# Patient Record
Sex: Female | Born: 1970 | Race: Black or African American | Hispanic: No | Marital: Single | Smoking: Never smoker
Health system: Southern US, Community
[De-identification: ages and names within clinical notes are randomized; demographics above are authoritative.]

## PROBLEM LIST (undated history)

## (undated) DIAGNOSIS — Z973 Presence of spectacles and contact lenses: Secondary | ICD-10-CM

## (undated) DIAGNOSIS — D509 Iron deficiency anemia, unspecified: Secondary | ICD-10-CM

## (undated) DIAGNOSIS — Z8719 Personal history of other diseases of the digestive system: Secondary | ICD-10-CM

## (undated) DIAGNOSIS — J45909 Unspecified asthma, uncomplicated: Secondary | ICD-10-CM

## (undated) DIAGNOSIS — D259 Leiomyoma of uterus, unspecified: Secondary | ICD-10-CM

## (undated) DIAGNOSIS — Z8711 Personal history of peptic ulcer disease: Secondary | ICD-10-CM

## (undated) DIAGNOSIS — L910 Hypertrophic scar: Secondary | ICD-10-CM

---

## 1999-05-20 ENCOUNTER — Other Ambulatory Visit: Admission: RE | Admit: 1999-05-20 | Discharge: 1999-05-20 | Payer: Self-pay | Admitting: Obstetrics and Gynecology

## 2000-07-15 ENCOUNTER — Other Ambulatory Visit: Admission: RE | Admit: 2000-07-15 | Discharge: 2000-07-15 | Payer: Self-pay | Admitting: Obstetrics and Gynecology

## 2001-12-01 ENCOUNTER — Other Ambulatory Visit: Admission: RE | Admit: 2001-12-01 | Discharge: 2001-12-01 | Payer: Self-pay | Admitting: Obstetrics and Gynecology

## 2004-02-26 ENCOUNTER — Other Ambulatory Visit: Admission: RE | Admit: 2004-02-26 | Discharge: 2004-02-26 | Payer: Self-pay | Admitting: Family Medicine

## 2004-09-26 ENCOUNTER — Observation Stay (HOSPITAL_COMMUNITY): Admission: RE | Admit: 2004-09-26 | Discharge: 2004-09-27 | Payer: Self-pay | Admitting: *Deleted

## 2004-09-26 ENCOUNTER — Encounter (INDEPENDENT_AMBULATORY_CARE_PROVIDER_SITE_OTHER): Payer: Self-pay | Admitting: *Deleted

## 2004-09-26 HISTORY — PX: LAPAROSCOPIC CHOLECYSTECTOMY: SUR755

## 2005-03-31 ENCOUNTER — Other Ambulatory Visit: Admission: RE | Admit: 2005-03-31 | Discharge: 2005-03-31 | Payer: Self-pay | Admitting: Family Medicine

## 2007-06-23 ENCOUNTER — Encounter (INDEPENDENT_AMBULATORY_CARE_PROVIDER_SITE_OTHER): Payer: Self-pay | Admitting: Nurse Practitioner

## 2007-06-23 ENCOUNTER — Ambulatory Visit: Payer: Self-pay | Admitting: *Deleted

## 2007-06-23 ENCOUNTER — Ambulatory Visit: Payer: Self-pay | Admitting: Family Medicine

## 2007-06-23 LAB — CONVERTED CEMR LAB
Alkaline Phosphatase: 52 units/L (ref 39–117)
Basophils Absolute: 0 10*3/uL (ref 0.0–0.1)
Eosinophils Absolute: 0.5 10*3/uL (ref 0.0–0.7)
Eosinophils Relative: 6 % — ABNORMAL HIGH (ref 0–5)
Glucose, Bld: 104 mg/dL — ABNORMAL HIGH (ref 70–99)
HCT: 39.1 % (ref 36.0–46.0)
Lymphs Abs: 2.3 10*3/uL (ref 0.7–4.0)
MCV: 92.2 fL (ref 78.0–100.0)
Platelets: 304 10*3/uL (ref 150–400)
RDW: 14.9 % (ref 11.5–15.5)
Sodium: 141 meq/L (ref 135–145)
Total Bilirubin: 0.4 mg/dL (ref 0.3–1.2)
Total Protein: 7 g/dL (ref 6.0–8.3)

## 2007-08-17 ENCOUNTER — Ambulatory Visit: Payer: Self-pay | Admitting: Internal Medicine

## 2007-08-17 ENCOUNTER — Encounter (INDEPENDENT_AMBULATORY_CARE_PROVIDER_SITE_OTHER): Payer: Self-pay | Admitting: Nurse Practitioner

## 2007-08-17 LAB — CONVERTED CEMR LAB
Cholesterol: 168 mg/dL (ref 0–200)
HDL: 50 mg/dL (ref >39)
Total CHOL/HDL Ratio: 3.4
Triglycerides: 52 mg/dL (ref <150)

## 2007-09-01 ENCOUNTER — Ambulatory Visit (HOSPITAL_COMMUNITY): Admission: RE | Admit: 2007-09-01 | Discharge: 2007-09-01 | Payer: Self-pay | Admitting: Family Medicine

## 2008-06-26 ENCOUNTER — Ambulatory Visit: Payer: Self-pay | Admitting: Internal Medicine

## 2008-06-26 ENCOUNTER — Encounter (INDEPENDENT_AMBULATORY_CARE_PROVIDER_SITE_OTHER): Payer: Self-pay | Admitting: Internal Medicine

## 2008-06-26 LAB — CONVERTED CEMR LAB
Basophils Absolute: 0 10*3/uL (ref 0.0–0.1)
Basophils Relative: 0 % (ref 0–1)
Eosinophils Absolute: 0.3 10*3/uL (ref 0.0–0.7)
MCHC: 33.9 g/dL (ref 30.0–36.0)
MCV: 90.3 fL (ref 78.0–100.0)
Neutrophils Relative %: 70 % (ref 43–77)
Platelets: 302 10*3/uL (ref 150–400)
RDW: 14.4 % (ref 11.5–15.5)
WBC: 8 10*3/uL (ref 4.0–10.5)

## 2008-08-17 ENCOUNTER — Ambulatory Visit (HOSPITAL_COMMUNITY): Admission: RE | Admit: 2008-08-17 | Discharge: 2008-08-17 | Payer: Self-pay | Admitting: Internal Medicine

## 2008-10-31 ENCOUNTER — Emergency Department (HOSPITAL_COMMUNITY): Admission: EM | Admit: 2008-10-31 | Discharge: 2008-10-31 | Payer: Self-pay | Admitting: Family Medicine

## 2008-11-07 ENCOUNTER — Ambulatory Visit: Payer: Self-pay | Admitting: Obstetrics and Gynecology

## 2009-03-26 ENCOUNTER — Encounter (INDEPENDENT_AMBULATORY_CARE_PROVIDER_SITE_OTHER): Payer: Self-pay | Admitting: Internal Medicine

## 2009-03-26 ENCOUNTER — Ambulatory Visit: Payer: Self-pay | Admitting: Internal Medicine

## 2009-03-26 LAB — CONVERTED CEMR LAB
Albumin: 4 g/dL (ref 3.5–5.2)
BUN: 11 mg/dL (ref 6–23)
Calcium: 8.6 mg/dL (ref 8.4–10.5)
Chloride: 108 meq/L (ref 96–112)
Glucose, Bld: 87 mg/dL (ref 70–99)
Potassium: 3.8 meq/L (ref 3.5–5.3)
Sodium: 141 meq/L (ref 135–145)
Total Protein: 7 g/dL (ref 6.0–8.3)

## 2009-12-31 ENCOUNTER — Ambulatory Visit (HOSPITAL_COMMUNITY): Admission: RE | Admit: 2009-12-31 | Discharge: 2009-12-31 | Payer: Self-pay | Admitting: Internal Medicine

## 2009-12-31 IMAGING — MG MM DIGITAL SCREENING
4 series · 4 of 4 positions shown · non-contrast
Comparison: Prior studies.

DG SCREEN MAMMOGRAM BILATERAL
Bilateral CC and MLO view(s) were taken.
Technologist: SIFUL.(SIFUL)(M)

DIGITAL SCREENING MAMMOGRAM WITH CAD:

[R CC]
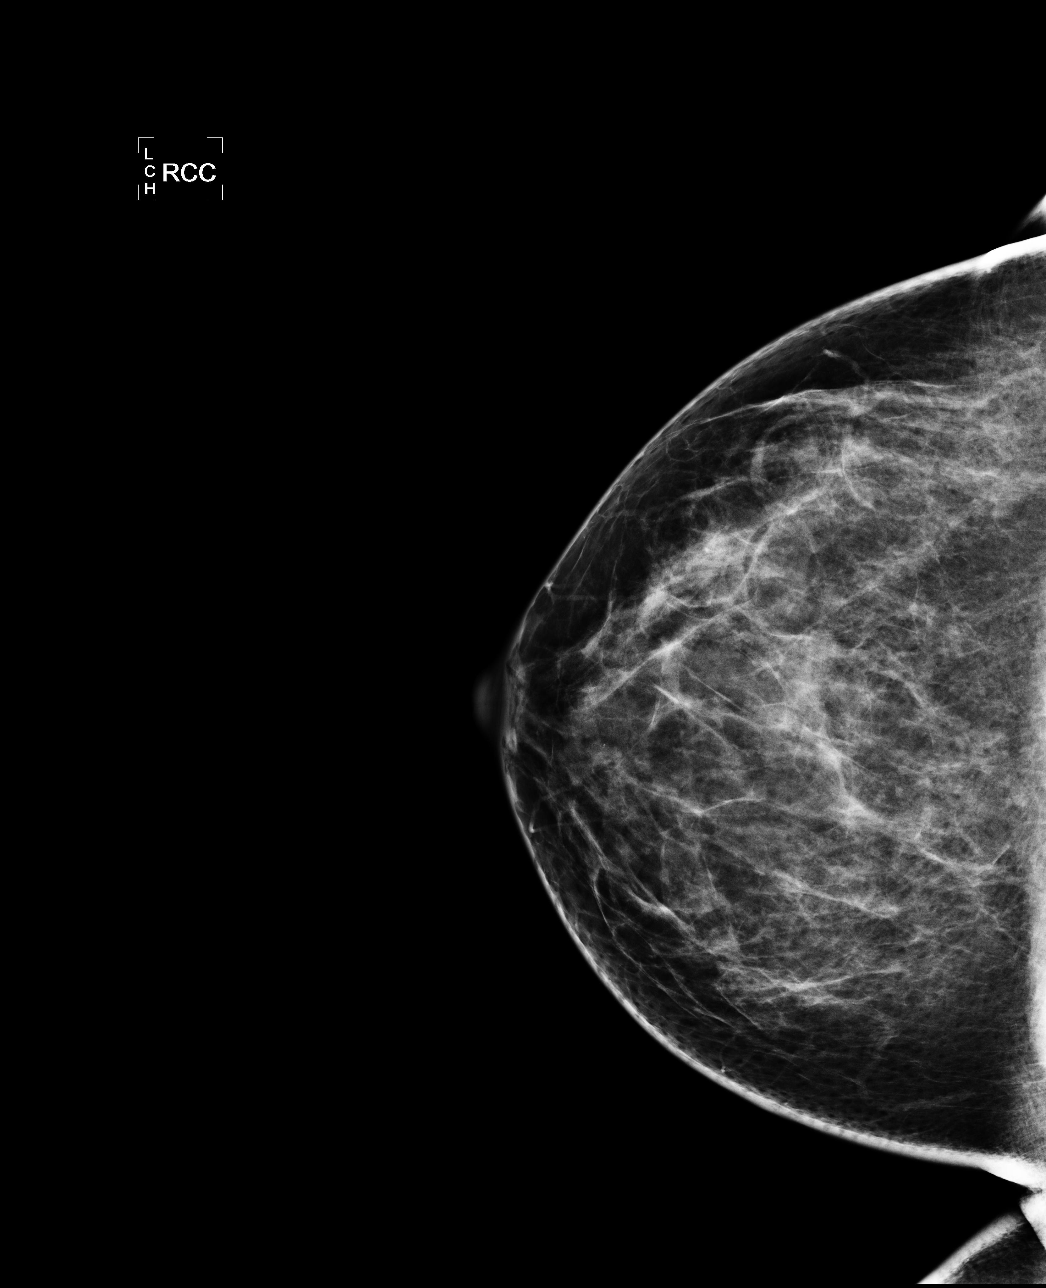

[R MLO]
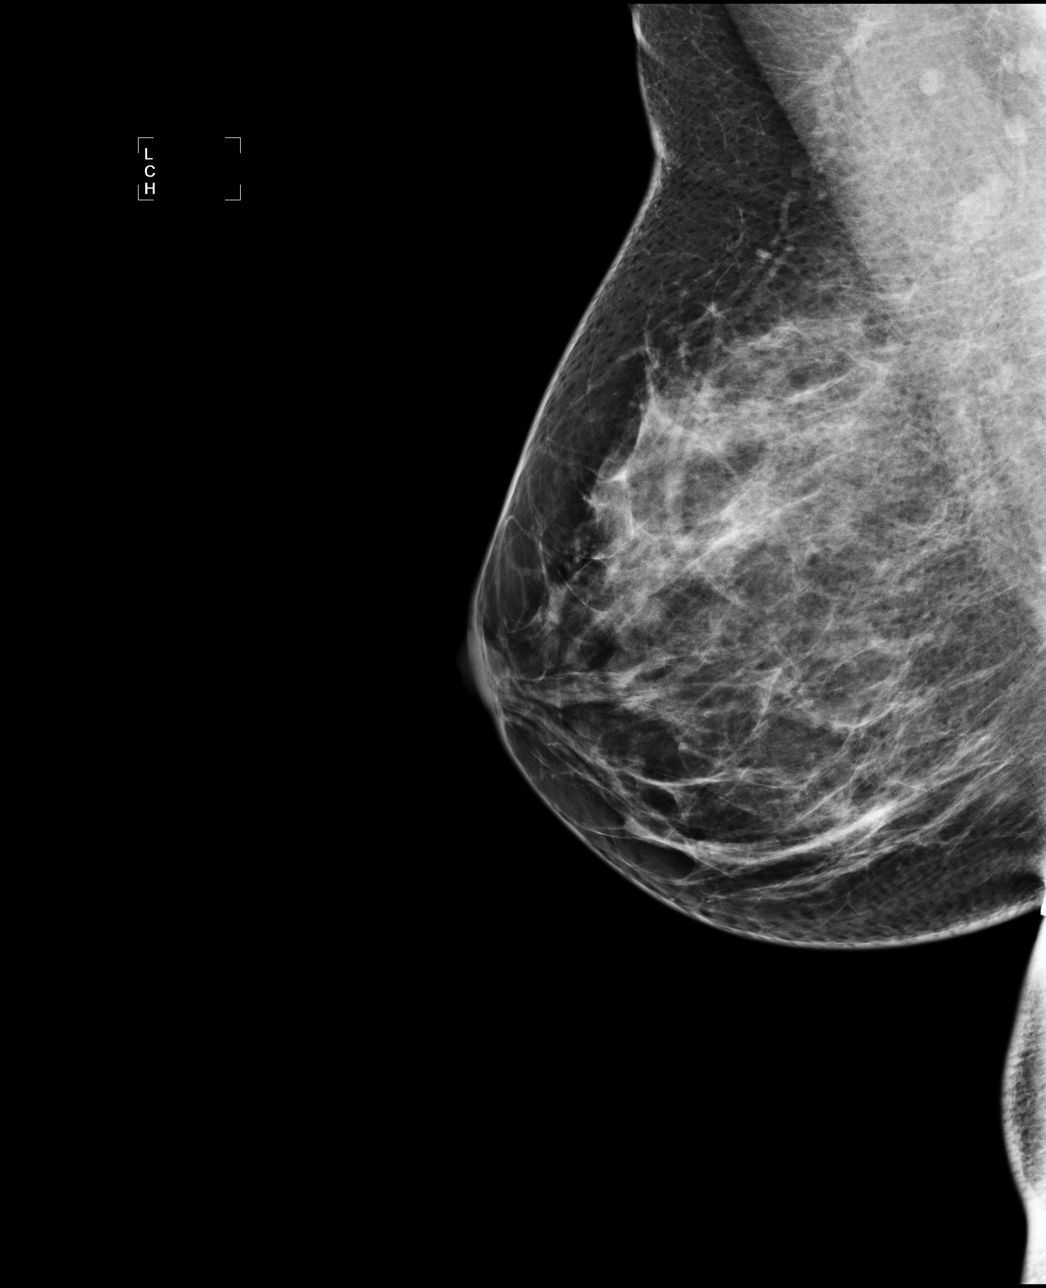

[L CC]
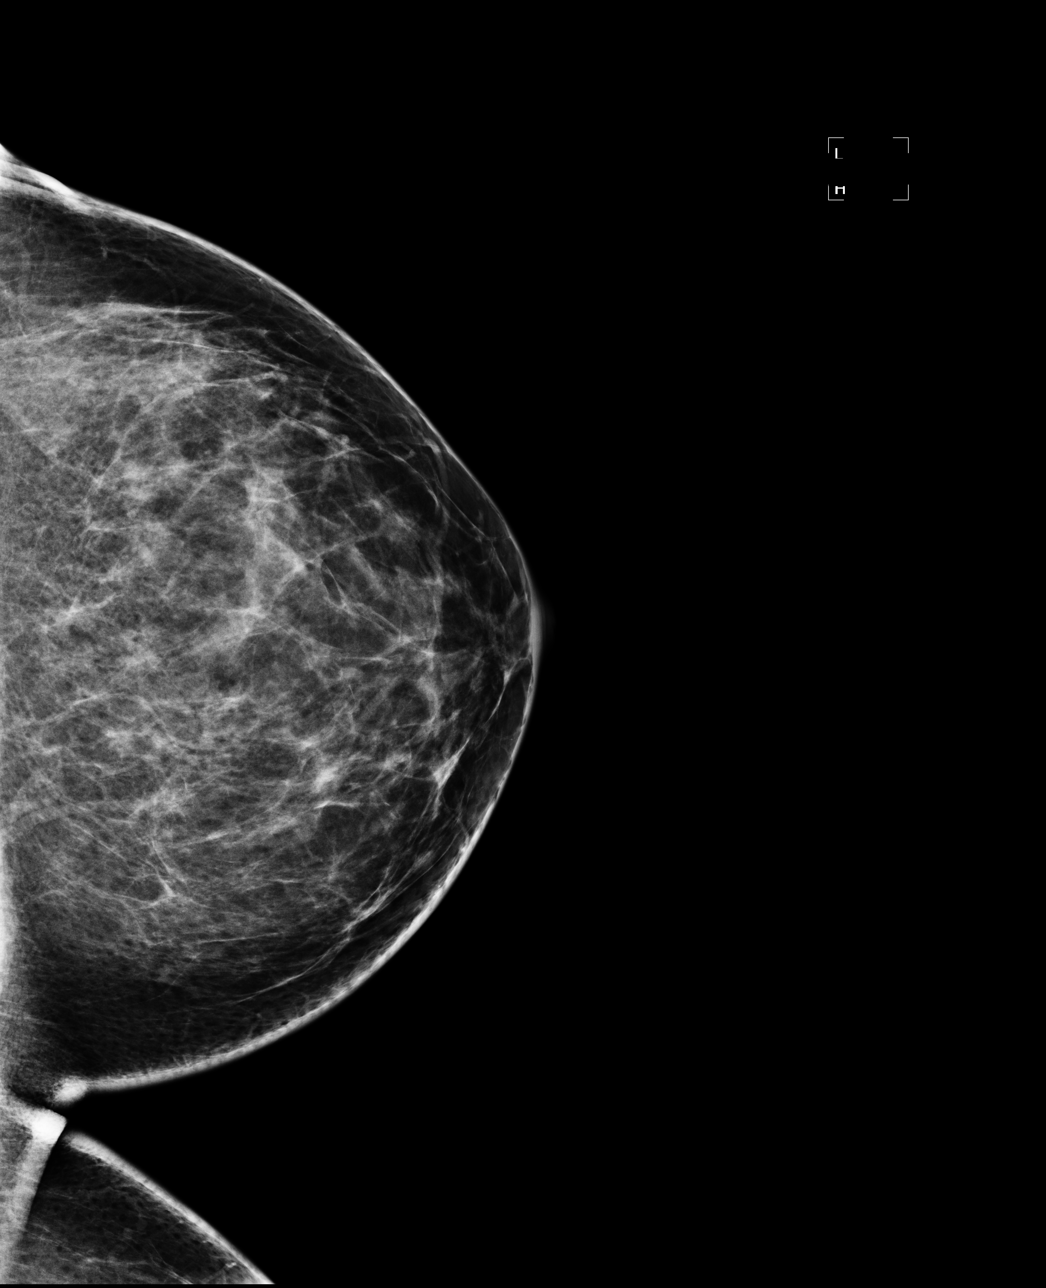

[L MLO]
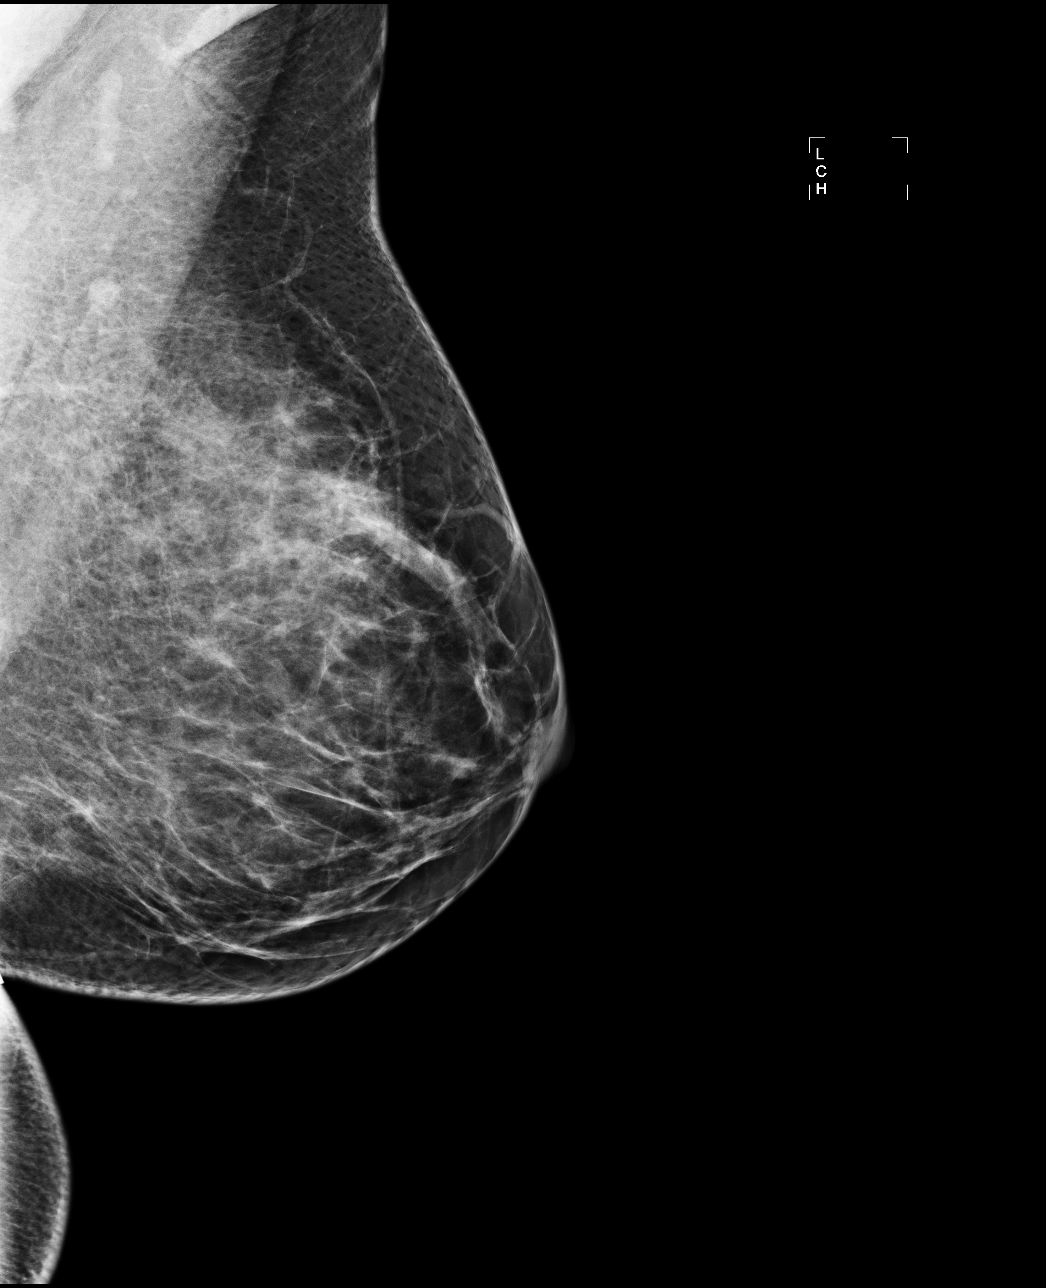

[4 of 4 positions shown; findings below may reference images not displayed]

There are scattered fibroglandular densities.  There is no dominant mass, architectural distortion 
or calcification to suggest malignancy.

Images were processed with CAD.
IMPRESSION: No mammographic evidence of malignancy.  Suggest yearly screening mammography.

A result letter of this screening mammogram will be mailed directly to the patient.

ASSESSMENT: Negative - BI-RADS 1

Screening mammogram in 1 year.
,

## 2010-08-26 NOTE — Group Therapy Note (Signed)
Judy Brown, Judy Brown NO.:  [id number]   MEDICAL RECORD NO.:  [id number]          PATIENT TYPE:  WOC   LOCATION:  [facility]                   FACILITY:  [facility]   PHYSICIAN:  Argentina Donovan, MD        DATE OF BIRTH:  1970/09/22   DATE OF SERVICE:                                  CLINIC NOTE   REASON FOR VISIT:  The patient has experienced increased bleeding with  menses on day 2 and 3 of cycle.   ALLERGIES:  No known drug allergies.   MEDICATIONS:  Singulair and Ventolin.   IMMUNIZATIONS:  Rubella and chickenpox as a child and tetanus in the  1990.  Not received a flu vaccination and did receive pneumonia  vaccination last year.   MENSTRUAL HISTORY:  LMP was October 16, 2008.  Menarche 40 years of age,  cycles regular, every 28 days, lasting approximately 5 days with heavy  flow.   CONTRACEPTIVE HISTORY:  IUD x3 years, Mirena.   OBSTETRICAL HISTORY:  No history of pregnancy.   GYNECOLOGIC HISTORY:  Last pap smear was in May 2009.  No history of  abnormal Pap smear.   SURGICAL HISTORY:  Gallbladder removal in June 2006.   FAMILY HISTORY:  Father with diabetes and father with high blood  pressure.   MEDICAL HISTORY:  Ulcer diagnosed in 2006, not requiring medications.  Asthma at 40 years of age, pneumonia in 76.   SOCIAL HISTORY:  Lives with self and is in a relationship.  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  Denies smoking, occasional  alcohol use, approximately 1-2 twice a month.  No history of sexual or  physical abuse.   REVIEW OF SYSTEMS:  Vaginal bleeding as stated in the chief complaint  and had some bleeding with intercourse.   PHYSICAL EXAMINATION:  VITAL SIGNS:  Temperature 98.7, pulse 98, blood  pressure 161/100, repeat blood pressure 156/94, weight 181 pounds, and  height 5 feet 3 inches.  GENERAL:  The patient is alert and oriented x3.  No signs of acute  distress.  PELVIC:  IUD, apparatus seen, partially out of the cervix,  removed  without difficulty with ring forceps.  No abnormal discharge seen.  Uterus is midline and slightly enlarged.   Ultrasound dated Aug 17, 2008, back to report to small fibroids  approximately 2 x 2 cm.   ASSESSMENT:  1. Menorrhagia.  2. High blood pressure.   PLAN:  The patient was given a prescription for Micronor.  I explained  the importance of taking at the same time every day.  The patient states  she will use condoms in addition to the Micronor.  Completed an  application for a free IUD with approval.  The patient will return to  the clinic to have it placed.  I stressed and emphasized the patient the  need for followup for high blood pressure with primary care Branko Steeves and  a possible need for oral management.  Again the patient will follow up  as needed.      Sid Falcon, CNM    ______________________________  Argentina Donovan, MD    WM/MEDQ  D:  11/07/2008  T:  11/08/2008  Job:  [id number]

## 2010-08-29 NOTE — Op Note (Signed)
Judy Brown, Judy Brown                ACCOUNT NO.:  [id number]   MEDICAL RECORD NO.:  [id number]          PATIENT TYPE:  OBV   LOCATION:  1503                         FACILITY:  [facility]   PHYSICIAN:  Vikki Ports, MDDATE OF BIRTH:  05-Nov-1970   DATE OF PROCEDURE:  09/26/2004  DATE OF DISCHARGE:  09/27/2004                                 OPERATIVE REPORT   PREOPERATIVE DIAGNOSIS:  Symptomatic cholelithiasis.   POSTOPERATIVE DIAGNOSIS:  Symptomatic cholelithiasis.   PROCEDURE:  Laparoscopic cholecystectomy.   SURGEON:  Vikki Ports, MD   ASSISTANT:  None.   ANESTHESIA:  General.   DESCRIPTION:  The patient was taken to the operating room and placed in a  supine position.  After adequate general anesthesia was induced, the abdomen  was prepped and draped in a normal sterile fashion.  Using a transverse  infraumbilical incision, I dissected down the fascia.  The fascia was opened  vertically.  A #1 Vicryl purse-string suture was placed around the fascial  defect.  An Hasson trocar was placed around the abdomen, and the abdomen was  insufflated with continuous low carbon dioxide.  Under direct visualization,  an 11 mm trocar was placed in the subxiphoid region.  Two 5 mm ports were  placed in the right abdomen.  The gallbladder was identified and retracted  cephalad.  A few filmy adhesions were taken off the gallbladder.  The neck  of the gallbladder was identified.  The cystic duct was easily dissected,  and a good window was created posterior to it.  Its junction with the  gallbladder and common duct were identified.  It was triply clipped and  divided.  The cystic artery was dissected free in a similar fashion, triply  clipped and divided.  The gallbladder was taken off the gallbladder bed  using Bovie electrocautery and removed through the umbilical port.  Adequate  hemostasis was insured, and the fascial defect was closed with the 0 Vicryl  purse-string suture.   Skin incisions were closed with subcuticular 4-0  Monocryl.  Steri-Strips and sterile dressings were applied.  The patient  tolerated the procedure well and went to the PACU in good condition.       KRH/MEDQ  D:  09/29/2004  T:  09/29/2004  Job:  [id number]

## 2011-05-27 ENCOUNTER — Other Ambulatory Visit (HOSPITAL_COMMUNITY): Payer: Self-pay | Admitting: Family Medicine

## 2011-05-27 DIAGNOSIS — Z1231 Encounter for screening mammogram for malignant neoplasm of breast: Secondary | ICD-10-CM

## 2011-05-28 ENCOUNTER — Ambulatory Visit (HOSPITAL_COMMUNITY)
Admission: RE | Admit: 2011-05-28 | Discharge: 2011-05-28 | Disposition: A | Discharge status: Home / Self Care | Payer: Self-pay | Source: Ambulatory Visit | Attending: Family Medicine | Admitting: Family Medicine

## 2011-05-28 DIAGNOSIS — Z1231 Encounter for screening mammogram for malignant neoplasm of breast: Secondary | ICD-10-CM | POA: Insufficient documentation

## 2014-04-04 ENCOUNTER — Other Ambulatory Visit: Payer: Self-pay | Admitting: Obstetrics & Gynecology

## 2014-04-11 LAB — CYTOLOGY - PAP

## 2016-01-14 DIAGNOSIS — D259 Leiomyoma of uterus, unspecified: Secondary | ICD-10-CM | POA: Diagnosis not present

## 2016-04-30 ENCOUNTER — Ambulatory Visit: Admit: 2016-04-30 | Payer: Self-pay | Admitting: Obstetrics & Gynecology

## 2016-04-30 SURGERY — DILATATION & CURETTAGE/HYSTEROSCOPY WITH MYOSURE
Anesthesia: Choice

## 2016-06-12 ENCOUNTER — Encounter (HOSPITAL_BASED_OUTPATIENT_CLINIC_OR_DEPARTMENT_OTHER): Payer: Self-pay | Admitting: *Deleted

## 2016-06-19 ENCOUNTER — Ambulatory Visit (HOSPITAL_BASED_OUTPATIENT_CLINIC_OR_DEPARTMENT_OTHER): Admit: 2016-06-19 | Payer: Self-pay | Admitting: Obstetrics & Gynecology

## 2016-06-19 ENCOUNTER — Encounter (HOSPITAL_BASED_OUTPATIENT_CLINIC_OR_DEPARTMENT_OTHER): Payer: Self-pay

## 2016-06-19 HISTORY — DX: Leiomyoma of uterus, unspecified: D25.9

## 2016-06-19 SURGERY — DILATATION & CURETTAGE/HYSTEROSCOPY WITH MYOSURE
Anesthesia: Choice

## 2016-07-23 DIAGNOSIS — Z01419 Encounter for gynecological examination (general) (routine) without abnormal findings: Secondary | ICD-10-CM | POA: Diagnosis not present

## 2016-07-23 DIAGNOSIS — Z6831 Body mass index (BMI) 31.0-31.9, adult: Secondary | ICD-10-CM | POA: Diagnosis not present

## 2016-07-23 DIAGNOSIS — Z1231 Encounter for screening mammogram for malignant neoplasm of breast: Secondary | ICD-10-CM | POA: Diagnosis not present

## 2016-09-23 ENCOUNTER — Encounter (HOSPITAL_BASED_OUTPATIENT_CLINIC_OR_DEPARTMENT_OTHER): Payer: Self-pay | Admitting: *Deleted

## 2016-09-23 NOTE — Progress Notes (Signed)
NPO AFTER MN.  ARRIVE AT 0600.  NEEDS URINE PREG.   GETTING CBC AND BMET DONE Wednesday 09-30-2016 OR Thursday 10-01-2016.

## 2016-09-28 NOTE — H&P (Signed)
Judy Brown is an 46 y.o. female with heavy, prolonged menstrual bleeding to anemia (nadir hgb 7.9).  Ultrasound in 01/2016 showed multiple fibroids (none >4cm) with a 3.4 cm fibroid abutting the endometrium.  She declines hysterectomy and hormonal treatment.    Pertinent Gynecological History: Menses: flow is excessive with use of 8 pads or tampons on heaviest days Bleeding: dysfunctional uterine bleeding Contraception: none DES exposure: unknown Blood transfusions: none Sexually transmitted diseases: no past history Previous GYN Procedures: none  Last mammogram: normal Date: 07/2016 Last pap: normal Date: 07/2016 OB History: G0   Menstrual History: Menarche age: n/a Patient's last menstrual period was 08/28/2016 (approximate).    Past Medical History:  Diagnosis Date  . History of gastric ulcer 2006  RESOLVED  . Iron deficiency anemia   . Keloid of skin    MULTIPLE KELOIDS ON BACK  . Mild asthma   . Uterine fibroid   . Wears glasses     Past Surgical History:  Procedure Laterality Date  . LAPAROSCOPIC CHOLECYSTECTOMY  09/26/2004    History reviewed. No pertinent family history.  Social History:  reports that she has never smoked. She has never used smokeless tobacco. She reports that she does not drink alcohol or use drugs.  Allergies:  Allergies  Allergen Reactions  . Hydrocodone Nausea Only    "VICODIN"  . Penicillins Hives    No prescriptions prior to admission.    ROS  Height 5' 2.5" (1.588 m), weight 165 lb (74.8 kg), last menstrual period 08/28/2016. Physical Exam  Constitutional: She is oriented to person, place, and time. She appears well-developed and well-nourished.  GI: There is no rebound and no guarding.  Neurological: She is alert and oriented to person, place, and time.  Skin: Skin is warm and dry.  Psychiatric: She has a normal mood and affect. Her behavior is normal.    No results found for this or any previous visit (from the past 24  hour(s)).  No results found.  Assessment/Plan: 45yo with HMB, intracavitary fibroid -H/S, D&C, Myosure -The patient has been counseled re: risk of bleeding, infection, scarring, and damage to surrounding structures. All questions were answered and the patient wishes to proceed.  Tressie Ragin 09/28/2016, 2:03 PM

## 2016-09-30 DIAGNOSIS — N859 Noninflammatory disorder of uterus, unspecified: Secondary | ICD-10-CM | POA: Diagnosis present

## 2016-09-30 DIAGNOSIS — Z6829 Body mass index (BMI) 29.0-29.9, adult: Secondary | ICD-10-CM | POA: Diagnosis not present

## 2016-09-30 DIAGNOSIS — I10 Essential (primary) hypertension: Secondary | ICD-10-CM | POA: Diagnosis not present

## 2016-09-30 DIAGNOSIS — N800 Endometriosis of uterus: Secondary | ICD-10-CM | POA: Diagnosis not present

## 2016-09-30 DIAGNOSIS — J449 Chronic obstructive pulmonary disease, unspecified: Secondary | ICD-10-CM | POA: Diagnosis not present

## 2016-09-30 DIAGNOSIS — D509 Iron deficiency anemia, unspecified: Secondary | ICD-10-CM | POA: Diagnosis not present

## 2016-09-30 LAB — BASIC METABOLIC PANEL
Anion gap: 7 (ref 5–15)
BUN: 10 mg/dL (ref 6–20)
CALCIUM: 8.6 mg/dL — AB (ref 8.9–10.3)
CO2: 26 mmol/L (ref 22–32)
CREATININE: 0.95 mg/dL (ref 0.44–1.00)
Chloride: 106 mmol/L (ref 101–111)
Glucose, Bld: 118 mg/dL — ABNORMAL HIGH (ref 65–99)
Potassium: 4.1 mmol/L (ref 3.5–5.1)
SODIUM: 139 mmol/L (ref 135–145)

## 2016-09-30 LAB — CBC
HCT: 33.7 % — ABNORMAL LOW (ref 36.0–46.0)
Hemoglobin: 11 g/dL — ABNORMAL LOW (ref 12.0–15.0)
MCH: 27.8 pg (ref 26.0–34.0)
MCHC: 32.6 g/dL (ref 30.0–36.0)
MCV: 85.1 fL (ref 78.0–100.0)
PLATELETS: 299 10*3/uL (ref 150–400)
RBC: 3.96 MIL/uL (ref 3.87–5.11)
RDW: 15.1 % (ref 11.5–15.5)
WBC: 7.2 10*3/uL (ref 4.0–10.5)

## 2016-09-30 NOTE — Progress Notes (Signed)
Pt unable to wait for labs to be drawn today. Instructed to come at 0530 10/02/2016 t have labs drawn day of surgery.

## 2016-09-30 NOTE — Progress Notes (Signed)
CBC and BMET results for today on chart.

## 2016-10-02 ENCOUNTER — Encounter (HOSPITAL_BASED_OUTPATIENT_CLINIC_OR_DEPARTMENT_OTHER)
Admission: RE | Disposition: A | Discharge status: Home / Self Care | Payer: Self-pay | Source: Ambulatory Visit | Attending: Obstetrics & Gynecology

## 2016-10-02 ENCOUNTER — Encounter (HOSPITAL_BASED_OUTPATIENT_CLINIC_OR_DEPARTMENT_OTHER): Payer: Self-pay | Admitting: *Deleted

## 2016-10-02 ENCOUNTER — Ambulatory Visit (HOSPITAL_BASED_OUTPATIENT_CLINIC_OR_DEPARTMENT_OTHER): Payer: BLUE CROSS/BLUE SHIELD | Admitting: Anesthesiology

## 2016-10-02 ENCOUNTER — Ambulatory Visit (HOSPITAL_BASED_OUTPATIENT_CLINIC_OR_DEPARTMENT_OTHER)
Admission: RE | Admit: 2016-10-02 | Discharge: 2016-10-02 | Disposition: A | Discharge status: Home / Self Care | Payer: BLUE CROSS/BLUE SHIELD | Source: Ambulatory Visit | Attending: Obstetrics & Gynecology | Admitting: Obstetrics & Gynecology

## 2016-10-02 DIAGNOSIS — N800 Endometriosis of uterus: Secondary | ICD-10-CM | POA: Diagnosis not present

## 2016-10-02 DIAGNOSIS — D259 Leiomyoma of uterus, unspecified: Secondary | ICD-10-CM | POA: Diagnosis not present

## 2016-10-02 DIAGNOSIS — Z6829 Body mass index (BMI) 29.0-29.9, adult: Secondary | ICD-10-CM | POA: Diagnosis not present

## 2016-10-02 DIAGNOSIS — I10 Essential (primary) hypertension: Secondary | ICD-10-CM | POA: Diagnosis not present

## 2016-10-02 DIAGNOSIS — J449 Chronic obstructive pulmonary disease, unspecified: Secondary | ICD-10-CM | POA: Diagnosis not present

## 2016-10-02 DIAGNOSIS — D250 Submucous leiomyoma of uterus: Secondary | ICD-10-CM

## 2016-10-02 DIAGNOSIS — J45909 Unspecified asthma, uncomplicated: Secondary | ICD-10-CM | POA: Diagnosis not present

## 2016-10-02 DIAGNOSIS — N920 Excessive and frequent menstruation with regular cycle: Secondary | ICD-10-CM | POA: Diagnosis not present

## 2016-10-02 DIAGNOSIS — D509 Iron deficiency anemia, unspecified: Secondary | ICD-10-CM | POA: Diagnosis not present

## 2016-10-02 DIAGNOSIS — N9489 Other specified conditions associated with female genital organs and menstrual cycle: Secondary | ICD-10-CM | POA: Diagnosis not present

## 2016-10-02 HISTORY — DX: Hypertrophic scar: L91.0

## 2016-10-02 HISTORY — DX: Unspecified asthma, uncomplicated: J45.909

## 2016-10-02 HISTORY — DX: Presence of spectacles and contact lenses: Z97.3

## 2016-10-02 HISTORY — PX: DILATATION & CURETTAGE/HYSTEROSCOPY WITH MYOSURE: SHX6511

## 2016-10-02 HISTORY — DX: Iron deficiency anemia, unspecified: D50.9

## 2016-10-02 HISTORY — DX: Personal history of other diseases of the digestive system: Z87.19

## 2016-10-02 HISTORY — DX: Personal history of peptic ulcer disease: Z87.11

## 2016-10-02 LAB — POCT PREGNANCY, URINE: PREG TEST UR: NEGATIVE

## 2016-10-02 SURGERY — DILATATION & CURETTAGE/HYSTEROSCOPY WITH MYOSURE
Anesthesia: General | Site: Vagina

## 2016-10-02 MED ORDER — ONDANSETRON HCL 4 MG/2ML IJ SOLN
INTRAMUSCULAR | Status: AC
  Filled 2016-10-02: qty 2

## 2016-10-02 MED ORDER — IBUPROFEN 800 MG PO TABS: 800.0000 mg | ORAL_TABLET | Freq: Four times a day (QID) | ORAL | 0 refills | Status: AC | PRN

## 2016-10-02 MED ORDER — FENTANYL CITRATE (PF) 100 MCG/2ML IJ SOLN
25.0000 ug | INTRAMUSCULAR | Status: DC | PRN
  Filled 2016-10-02: qty 1

## 2016-10-02 MED ORDER — MIDAZOLAM HCL 5 MG/5ML IJ SOLN
INTRAMUSCULAR | Status: DC | PRN
  Administered 2016-10-02: 2 mg via INTRAVENOUS

## 2016-10-02 MED ORDER — FENTANYL CITRATE (PF) 100 MCG/2ML IJ SOLN
INTRAMUSCULAR | Status: AC
  Filled 2016-10-02: qty 2

## 2016-10-02 MED ORDER — ARTIFICIAL TEARS OPHTHALMIC OINT
TOPICAL_OINTMENT | OPHTHALMIC | Status: AC
  Filled 2016-10-02: qty 3.5

## 2016-10-02 MED ORDER — DEXAMETHASONE SODIUM PHOSPHATE 10 MG/ML IJ SOLN
INTRAMUSCULAR | Status: AC
  Filled 2016-10-02: qty 1

## 2016-10-02 MED ORDER — MIDAZOLAM HCL 2 MG/2ML IJ SOLN
0.5000 mg | Freq: Once | INTRAMUSCULAR | Status: DC | PRN
  Filled 2016-10-02: qty 2

## 2016-10-02 MED ORDER — SODIUM CHLORIDE 0.9 % IR SOLN
Status: DC | PRN
  Administered 2016-10-02: 3000 mL

## 2016-10-02 MED ORDER — LIDOCAINE 2% (20 MG/ML) 5 ML SYRINGE
INTRAMUSCULAR | Status: AC
  Filled 2016-10-02: qty 5

## 2016-10-02 MED ORDER — LACTATED RINGERS IV SOLN
INTRAVENOUS | Status: DC
  Administered 2016-10-02: 07:00:00 via INTRAVENOUS
  Filled 2016-10-02: qty 1000

## 2016-10-02 MED ORDER — DEXAMETHASONE SODIUM PHOSPHATE 10 MG/ML IJ SOLN
INTRAMUSCULAR | Status: DC | PRN
  Administered 2016-10-02: 10 mg via INTRAVENOUS

## 2016-10-02 MED ORDER — MEPERIDINE HCL 25 MG/ML IJ SOLN
6.2500 mg | INTRAMUSCULAR | Status: DC | PRN
  Filled 2016-10-02: qty 1

## 2016-10-02 MED ORDER — OXYCODONE-ACETAMINOPHEN 5-325 MG PO TABS: 1.0000 | ORAL_TABLET | ORAL | 0 refills | Status: AC | PRN

## 2016-10-02 MED ORDER — KETOROLAC TROMETHAMINE 30 MG/ML IJ SOLN
INTRAMUSCULAR | Status: AC
  Filled 2016-10-02: qty 1

## 2016-10-02 MED ORDER — PROPOFOL 10 MG/ML IV BOLUS
INTRAVENOUS | Status: AC
  Filled 2016-10-02: qty 40

## 2016-10-02 MED ORDER — ONDANSETRON HCL 4 MG/2ML IJ SOLN
INTRAMUSCULAR | Status: DC | PRN
  Administered 2016-10-02: 4 mg via INTRAVENOUS

## 2016-10-02 MED ORDER — PROPOFOL 10 MG/ML IV BOLUS
INTRAVENOUS | Status: DC | PRN
  Administered 2016-10-02: 200 mg via INTRAVENOUS

## 2016-10-02 MED ORDER — KETOROLAC TROMETHAMINE 30 MG/ML IJ SOLN
INTRAMUSCULAR | Status: DC | PRN
  Administered 2016-10-02: 30 mg via INTRAVENOUS

## 2016-10-02 MED ORDER — FENTANYL CITRATE (PF) 100 MCG/2ML IJ SOLN
INTRAMUSCULAR | Status: DC | PRN
  Administered 2016-10-02: 50 ug via INTRAVENOUS

## 2016-10-02 MED ORDER — LIDOCAINE 2% (20 MG/ML) 5 ML SYRINGE
INTRAMUSCULAR | Status: DC | PRN
  Administered 2016-10-02: 30 mg via INTRAVENOUS

## 2016-10-02 MED ORDER — PROMETHAZINE HCL 25 MG/ML IJ SOLN
6.2500 mg | INTRAMUSCULAR | Status: DC | PRN
  Filled 2016-10-02: qty 1

## 2016-10-02 MED ORDER — MIDAZOLAM HCL 2 MG/2ML IJ SOLN
INTRAMUSCULAR | Status: AC
  Filled 2016-10-02: qty 2

## 2016-10-02 SURGICAL SUPPLY — 32 items
BIPOLAR CUTTING LOOP 21FR (ELECTRODE)
CANISTER SUCT 3000ML PPV (MISCELLANEOUS) ×2 IMPLANT
CATH ROBINSON RED A/P 16FR (CATHETERS) ×2 IMPLANT
CLOTH BEACON ORANGE TIMEOUT ST (SAFETY) ×2 IMPLANT
COUNTER NEEDLE 1200 MAGNETIC (NEEDLE) ×2 IMPLANT
DEVICE MYOSURE LITE (MISCELLANEOUS) IMPLANT
DEVICE MYOSURE REACH (MISCELLANEOUS) ×1 IMPLANT
DILATOR CANAL MILEX (MISCELLANEOUS) IMPLANT
ELECT REM PT RETURN 9FT ADLT (ELECTROSURGICAL)
ELECTRODE REM PT RTRN 9FT ADLT (ELECTROSURGICAL) IMPLANT
FILTER ARTHROSCOPY CONVERTOR (FILTER) ×2 IMPLANT
GLOVE BIO SURGEON STRL SZ 6 (GLOVE) ×4 IMPLANT
GLOVE BIO SURGEON STRL SZ 6.5 (GLOVE) ×1 IMPLANT
GLOVE BIOGEL PI IND STRL 6 (GLOVE) ×1 IMPLANT
GLOVE BIOGEL PI IND STRL 6.5 (GLOVE) IMPLANT
GLOVE BIOGEL PI INDICATOR 6 (GLOVE) ×1
GLOVE BIOGEL PI INDICATOR 6.5 (GLOVE) ×1
GLOVE SURG SS PI 6.0 STRL IVOR (GLOVE) ×4 IMPLANT
GOWN STRL REUS W/ TWL LRG LVL3 (GOWN DISPOSABLE) ×2 IMPLANT
GOWN STRL REUS W/TWL LRG LVL3 (GOWN DISPOSABLE) ×6 IMPLANT
IV NS IRRIG 3000ML ARTHROMATIC (IV SOLUTION) ×2 IMPLANT
KIT RM TURNOVER CYSTO AR (KITS) ×2 IMPLANT
LOOP CUTTING BIPOLAR 21FR (ELECTRODE) IMPLANT
MYOSURE XL FIBROID REM (MISCELLANEOUS)
PACK VAGINAL MINOR WOMEN LF (CUSTOM PROCEDURE TRAY) ×2 IMPLANT
PAD OB MATERNITY 4.3X12.25 (PERSONAL CARE ITEMS) ×2 IMPLANT
SEAL ROD LENS SCOPE MYOSURE (ABLATOR) ×2 IMPLANT
SYSTEM TISS REMOVAL MYSR XL RM (MISCELLANEOUS) IMPLANT
TOWEL OR 17X24 6PK STRL BLUE (TOWEL DISPOSABLE) ×4 IMPLANT
TUBING AQUILEX INFLOW (TUBING) ×2 IMPLANT
TUBING AQUILEX OUTFLOW (TUBING) ×2 IMPLANT
WATER STERILE IRR 500ML POUR (IV SOLUTION) ×2 IMPLANT

## 2016-10-02 NOTE — Anesthesia Postprocedure Evaluation (Signed)
Anesthesia Post Note  Patient: Judy Brown  Procedure(s) Performed: Procedure(s) (LRB): DILATATION & CURETTAGE/HYSTEROSCOPY WITH MYOSURE (N/A)     Patient location during evaluation: PACU Anesthesia Type: General Level of consciousness: awake and alert, patient cooperative and oriented Pain management: pain level controlled Vital Signs Assessment: post-procedure vital signs reviewed and stable Respiratory status: spontaneous breathing, nonlabored ventilation and respiratory function stable Cardiovascular status: blood pressure returned to baseline and stable Postop Assessment: no signs of nausea or vomiting Anesthetic complications: no    Last Vitals:  Vitals:   10/02/16 0845 10/02/16 0915  BP: (!) 150/94 (!) 167/86  Pulse: 85 78  Resp: 14 16  Temp:  36.7 C    Last Pain:  Vitals:   10/02/16 0830  TempSrc:   PainSc: 0-No pain                 Keaja Reaume,E. Lochlann Mastrangelo

## 2016-10-02 NOTE — Anesthesia Procedure Notes (Signed)
Procedure Name: LMA Insertion Date/Time: 10/02/2016 7:27 AM Performed by: Wanita Chamberlain Pre-anesthesia Checklist: Timeout performed, Patient identified, Emergency Drugs available, Suction available and Patient being monitored Patient Re-evaluated:Patient Re-evaluated prior to inductionOxygen Delivery Method: Circle system utilized Preoxygenation: Pre-oxygenation with 100% oxygen Intubation Type: IV induction Ventilation: Mask ventilation without difficulty LMA: LMA inserted LMA Size: 4.0 Number of attempts: 1 Airway Equipment and Method: Bite block (gauze bite block b/t central incisors) Placement Confirmation: positive ETCO2 and breath sounds checked- equal and bilateral Tube secured with: Tape Dental Injury: Teeth and Oropharynx as per pre-operative assessment

## 2016-10-02 NOTE — Discharge Instructions (Signed)
° °  D & C Home care Instructions:   Personal hygiene:  Used sanitary napkins for vaginal drainage not tampons. Shower or tub bathe the day after your procedure. No douching until bleeding stops. Always wipe from front to back after  Elimination.  Activity: Do not drive or operate any equipment today. The effects of the anesthesia are still present and drowsiness may result. Rest today, not necessarily flat bed rest, just take it easy. You may resume your normal activity in one to 2 days.  Sexual activity: No intercourse for one week or as indicated by your physician  Diet: Eat a light diet as desired this evening. You may resume a regular diet tomorrow.  Return to work: One to 2 days.  General Expectations of your surgery: Vaginal bleeding should be no heavier than a normal period. Spotting may continue up to 10 days. Mild cramps may continue for a couple of days. You may have a regular period in 2-6 weeks.  Unexpected observations call your doctor if these occur: persistent or heavy bleeding. Severe abdominal cramping or pain. Elevation of temperature greater than 100F.  Call for an appointment in one week.    Patient's Signature_______________________________________________________  Nurse's Signature________________________________________________________ Post Anesthesia Home Care Instructions  Activity: Get plenty of rest for the remainder of the day. A responsible individual must stay with you for 24 hours following the procedure.  For the next 24 hours, DO NOT: -Drive a car -Paediatric nurse -Drink alcoholic beverages -Take any medication unless instructed by your physician -Make any legal decisions or sign important papers.  Meals: Start with liquid foods such as gelatin or soup. Progress to regular foods as tolerated. Avoid greasy, spicy, heavy foods. If nausea and/or vomiting occur, drink only clear liquids until the nausea and/or vomiting subsides. Call your physician  if vomiting continues.  Special Instructions/Symptoms: Your throat may feel dry or sore from the anesthesia or the breathing tube placed in your throat during surgery. If this causes discomfort, gargle with warm salt water. The discomfort should disappear within 24 hours.  If you had a scopolamine patch placed behind your ear for the management of post- operative nausea and/or vomiting:  1. The medication in the patch is effective for 72 hours, after which it should be removed.  Wrap patch in a tissue and discard in the trash. Wash hands thoroughly with soap and water. 2. You may remove the patch earlier than 72 hours if you experience unpleasant side effects which may include dry mouth, dizziness or visual disturbances. 3. Avoid touching the patch. Wash your hands with soap and water after contact with the patch.   Call MD for T>100.4, heavy vaginal bleeding, severe abdominal pain, intractable nausea and/or vomiting, or respiratory distress.  Call office to schedule postop appointment in 2 weeks.  No driving while taking narcotics.  Pelvic rest x 4 weeks.

## 2016-10-02 NOTE — Transfer of Care (Signed)
Immediate Anesthesia Transfer of Care Note  Patient: Judy Brown  Procedure(s) Performed: Procedure(s): DILATATION & CURETTAGE/HYSTEROSCOPY WITH MYOSURE (N/A)  Patient Location: PACU  Anesthesia Type:General  Level of Consciousness: awake, alert , oriented and patient cooperative  Airway & Oxygen Therapy: Patient Spontanous Breathing and Patient connected to nasal cannula oxygen  Post-op Assessment: Report given to RN and Post -op Vital signs reviewed and stable  Post vital signs: Reviewed and stable  Last Vitals:  Vitals:   10/02/16 0602  BP: (!) 169/83  Pulse: 85  Resp: 16  Temp: 37.1 C    Last Pain:  Vitals:   10/02/16 0602  TempSrc: Oral      Patients Stated Pain Goal: 5 (91/63/84 6659)  Complications: No apparent anesthesia complications

## 2016-10-02 NOTE — Op Note (Signed)
PREOPERATIVE DIAGNOSIS:  46 y.o. with heavy menstrual bleeding and intracavitary mass  POSTOPERATIVE DIAGNOSIS: The same  PROCEDURE: Hysteroscopy, Dilation and Curettage, Myosure  SURGEON:  Dr. Linda Hedges  INDICATIONS: 46 y.o. here for scheduled surgery for treatment of heavy menstrual bleeding and intracavitary mass (polyp vs fibroid).   Risks of surgery were discussed with the patient including but not limited to: bleeding which may require transfusion; infection which may require antibiotics; injury to uterus or surrounding organs; intrauterine scarring which may impair future fertility; need for additional procedures including laparotomy or laparoscopy; and other postoperative/anesthesia complications. Written informed consent was obtained.    FINDINGS:  An 8 week size uterus.  Diffuse proliferative endometrium.  Polyp vs fibroid attached to anterior wall.  Normal ostia bilaterally.  ANESTHESIA:   General  ESTIMATED BLOOD LOSS:  Less than 20 ml  SPECIMENS: Endometrial curettings sent to pathology  COMPLICATIONS:  None immediate.  PROCEDURE DETAILS:  The patient was taken to the operating room where general anesthesia was administered and was found to be adequate.  After an adequate timeout was performed, she was placed in the dorsal lithotomy position and examined; then prepped and draped in the sterile manner.   Her bladder was catheterized for an unmeasured amount of clear, yellow urine. A speculum was then placed in the patient's vagina and a single tooth tenaculum was applied to the anterior lip of the cervix. The uteus was sounded to 10 cm and dilated manually with metal dilators to accommodate the Myosure hysteroscope.  Once the cervix was dilated, the hysteroscope was inserted under direct visualization using saline as a suspension medium.  The uterine cavity was carefully examined with the above dictated findings.   After further careful visualization of the uterine cavity, the  hysteroscope was removed under direct visualization.  A sharp curettage was then performed to obtain a moderate amount of endometrial curettings.  The Classic Myosure was opened and advanced using the hysteroscope.  Remaining mass on the anterior uterine wall was removed.  A smooth endometrial cavity was noted.  The hysteroscope was removed.  The tenaculum was removed from the anterior lip of the cervix and the vaginal speculum was removed after noting good hemostasis.  The patient tolerated the procedure well and was taken to the recovery area awake, extubated and in stable condition.

## 2016-10-02 NOTE — Progress Notes (Signed)
No change to H&P.  Judy Slivka, DO 

## 2016-10-02 NOTE — Anesthesia Preprocedure Evaluation (Addendum)
Anesthesia Evaluation  Patient identified by MRN, date of birth, ID band Patient awake    Reviewed: Allergy & Precautions, NPO status , Patient's Chart, lab work & pertinent test results  History of Anesthesia Complications Negative for: history of anesthetic complications  Airway Mallampati: II  TM Distance: >3 FB Neck ROM: Full    Dental  (+) Dental Advisory Given, Chipped,    Pulmonary asthma , COPD: last inhaler use a week ago.,    breath sounds clear to auscultation       Cardiovascular hypertension (no longer requires medication),  Rhythm:Regular Rate:Normal     Neuro/Psych negative neurological ROS     GI/Hepatic negative GI ROS, Neg liver ROS,   Endo/Other  Morbid obesity  Renal/GU negative Renal ROS     Musculoskeletal   Abdominal (+) + obese,   Peds  Hematology  (+) Blood dyscrasia (Hb 11.0), anemia ,   Anesthesia Other Findings   Reproductive/Obstetrics Fibroid                          Anesthesia Physical Anesthesia Plan  ASA: II  Anesthesia Plan: General   Post-op Pain Management:    Induction: Intravenous  PONV Risk Score and Plan: 3 and Ondansetron, Dexamethasone, Propofol and Midazolam  Airway Management Planned: LMA  Additional Equipment:   Intra-op Plan:   Post-operative Plan:   Informed Consent: I have reviewed the patients History and Physical, chart, labs and discussed the procedure including the risks, benefits and alternatives for the proposed anesthesia with the patient or authorized representative who has indicated his/her understanding and acceptance.   Dental advisory given  Plan Discussed with: CRNA and Surgeon  Anesthesia Plan Comments: (Plan routine monitors, GA- LMA OK)        Anesthesia Quick Evaluation

## 2016-10-05 ENCOUNTER — Encounter (HOSPITAL_BASED_OUTPATIENT_CLINIC_OR_DEPARTMENT_OTHER): Payer: Self-pay | Admitting: Obstetrics & Gynecology

## 2016-11-16 DIAGNOSIS — J4520 Mild intermittent asthma, uncomplicated: Secondary | ICD-10-CM | POA: Diagnosis not present

## 2016-11-16 DIAGNOSIS — K219 Gastro-esophageal reflux disease without esophagitis: Secondary | ICD-10-CM | POA: Diagnosis not present

## 2016-11-16 DIAGNOSIS — R635 Abnormal weight gain: Secondary | ICD-10-CM | POA: Diagnosis not present

## 2016-11-16 DIAGNOSIS — R030 Elevated blood-pressure reading, without diagnosis of hypertension: Secondary | ICD-10-CM | POA: Diagnosis not present

## 2016-11-18 DIAGNOSIS — R030 Elevated blood-pressure reading, without diagnosis of hypertension: Secondary | ICD-10-CM | POA: Diagnosis not present

## 2016-11-18 DIAGNOSIS — K219 Gastro-esophageal reflux disease without esophagitis: Secondary | ICD-10-CM | POA: Diagnosis not present

## 2016-12-09 DIAGNOSIS — N920 Excessive and frequent menstruation with regular cycle: Secondary | ICD-10-CM | POA: Diagnosis not present

## 2016-12-18 DIAGNOSIS — Z30430 Encounter for insertion of intrauterine contraceptive device: Secondary | ICD-10-CM | POA: Diagnosis not present

## 2016-12-18 DIAGNOSIS — Z3202 Encounter for pregnancy test, result negative: Secondary | ICD-10-CM | POA: Diagnosis not present

## 2016-12-21 DIAGNOSIS — N921 Excessive and frequent menstruation with irregular cycle: Secondary | ICD-10-CM | POA: Diagnosis not present

## 2016-12-21 DIAGNOSIS — Z30432 Encounter for removal of intrauterine contraceptive device: Secondary | ICD-10-CM | POA: Diagnosis not present

## 2016-12-21 DIAGNOSIS — R102 Pelvic and perineal pain: Secondary | ICD-10-CM | POA: Diagnosis not present

## 2016-12-21 DIAGNOSIS — Z30431 Encounter for routine checking of intrauterine contraceptive device: Secondary | ICD-10-CM | POA: Diagnosis not present

## 2017-01-11 DIAGNOSIS — Z30430 Encounter for insertion of intrauterine contraceptive device: Secondary | ICD-10-CM | POA: Diagnosis not present

## 2017-01-11 DIAGNOSIS — Z3202 Encounter for pregnancy test, result negative: Secondary | ICD-10-CM | POA: Diagnosis not present

## 2017-02-22 DIAGNOSIS — Z30431 Encounter for routine checking of intrauterine contraceptive device: Secondary | ICD-10-CM | POA: Diagnosis not present

## 2017-08-24 DIAGNOSIS — Z01419 Encounter for gynecological examination (general) (routine) without abnormal findings: Secondary | ICD-10-CM | POA: Diagnosis not present

## 2017-08-24 DIAGNOSIS — Z1231 Encounter for screening mammogram for malignant neoplasm of breast: Secondary | ICD-10-CM | POA: Diagnosis not present

## 2017-08-24 DIAGNOSIS — Z6831 Body mass index (BMI) 31.0-31.9, adult: Secondary | ICD-10-CM | POA: Diagnosis not present

## 2017-09-15 DIAGNOSIS — E669 Obesity, unspecified: Secondary | ICD-10-CM | POA: Diagnosis not present

## 2017-09-15 DIAGNOSIS — I10 Essential (primary) hypertension: Secondary | ICD-10-CM | POA: Diagnosis not present

## 2017-09-15 DIAGNOSIS — G441 Vascular headache, not elsewhere classified: Secondary | ICD-10-CM | POA: Diagnosis not present

## 2017-09-15 DIAGNOSIS — E559 Vitamin D deficiency, unspecified: Secondary | ICD-10-CM | POA: Diagnosis not present

## 2017-09-15 DIAGNOSIS — Z79899 Other long term (current) drug therapy: Secondary | ICD-10-CM | POA: Diagnosis not present

## 2017-09-15 DIAGNOSIS — E876 Hypokalemia: Secondary | ICD-10-CM | POA: Diagnosis not present

## 2017-09-15 DIAGNOSIS — Z0000 Encounter for general adult medical examination without abnormal findings: Secondary | ICD-10-CM | POA: Diagnosis not present

## 2018-05-06 DIAGNOSIS — I10 Essential (primary) hypertension: Secondary | ICD-10-CM | POA: Diagnosis not present

## 2018-05-06 DIAGNOSIS — E559 Vitamin D deficiency, unspecified: Secondary | ICD-10-CM | POA: Diagnosis not present

## 2018-10-25 DIAGNOSIS — Z1231 Encounter for screening mammogram for malignant neoplasm of breast: Secondary | ICD-10-CM | POA: Diagnosis not present

## 2018-10-25 DIAGNOSIS — Z6831 Body mass index (BMI) 31.0-31.9, adult: Secondary | ICD-10-CM | POA: Diagnosis not present

## 2018-10-25 DIAGNOSIS — Z01419 Encounter for gynecological examination (general) (routine) without abnormal findings: Secondary | ICD-10-CM | POA: Diagnosis not present

## 2019-03-31 DIAGNOSIS — E559 Vitamin D deficiency, unspecified: Secondary | ICD-10-CM | POA: Diagnosis not present

## 2019-03-31 DIAGNOSIS — Z0000 Encounter for general adult medical examination without abnormal findings: Secondary | ICD-10-CM | POA: Diagnosis not present

## 2019-03-31 DIAGNOSIS — K219 Gastro-esophageal reflux disease without esophagitis: Secondary | ICD-10-CM | POA: Diagnosis not present

## 2019-03-31 DIAGNOSIS — I10 Essential (primary) hypertension: Secondary | ICD-10-CM | POA: Diagnosis not present

## 2021-06-13 DIAGNOSIS — I10 Essential (primary) hypertension: Secondary | ICD-10-CM | POA: Diagnosis not present

## 2021-11-21 DIAGNOSIS — Z01419 Encounter for gynecological examination (general) (routine) without abnormal findings: Secondary | ICD-10-CM | POA: Diagnosis not present

## 2021-11-21 DIAGNOSIS — Z1231 Encounter for screening mammogram for malignant neoplasm of breast: Secondary | ICD-10-CM | POA: Diagnosis not present

## 2021-11-21 DIAGNOSIS — Z6828 Body mass index (BMI) 28.0-28.9, adult: Secondary | ICD-10-CM | POA: Diagnosis not present

## 2021-11-26 ENCOUNTER — Other Ambulatory Visit: Payer: Self-pay | Admitting: Obstetrics & Gynecology

## 2021-11-26 DIAGNOSIS — R928 Other abnormal and inconclusive findings on diagnostic imaging of breast: Secondary | ICD-10-CM

## 2021-12-04 ENCOUNTER — Other Ambulatory Visit: Payer: BLUE CROSS/BLUE SHIELD

## 2022-02-18 DIAGNOSIS — R92322 Mammographic fibroglandular density, left breast: Secondary | ICD-10-CM | POA: Diagnosis not present

## 2022-04-22 DIAGNOSIS — Z136 Encounter for screening for cardiovascular disorders: Secondary | ICD-10-CM | POA: Diagnosis not present

## 2022-04-22 DIAGNOSIS — I10 Essential (primary) hypertension: Secondary | ICD-10-CM | POA: Diagnosis not present
